# Patient Record
Sex: Male | Born: 1980
Health system: Southern US, Community
[De-identification: ages and names within clinical notes are randomized; demographics above are authoritative.]

---

## 2015-01-27 ENCOUNTER — Emergency Department (HOSPITAL_COMMUNITY)
Admission: EM | Admit: 2015-01-27 | Discharge: 2015-01-27 | Disposition: A | Discharge status: Home / Self Care | Payer: Self-pay | Attending: Emergency Medicine | Admitting: Emergency Medicine

## 2015-01-27 ENCOUNTER — Emergency Department (HOSPITAL_COMMUNITY): Payer: Self-pay

## 2015-01-27 DIAGNOSIS — M79604 Pain in right leg: Secondary | ICD-10-CM | POA: Insufficient documentation

## 2015-01-27 DIAGNOSIS — R21 Rash and other nonspecific skin eruption: Secondary | ICD-10-CM | POA: Insufficient documentation

## 2015-01-27 MED ORDER — IBUPROFEN 200 MG PO TABS
600.0000 mg | ORAL_TABLET | Freq: Once | ORAL | Status: AC
  Administered 2015-01-27: 600 mg via ORAL
  Filled 2015-01-27: qty 3
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
## 2015-01-27 NOTE — ED Notes (Signed)
Patient returned from X-ray 
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
## 2015-01-27 NOTE — ED Notes (Signed)
Pt transported to Xray. 
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
